# Patient Record
Sex: Female | Born: 1950 | Race: White | Hispanic: No | Marital: Single | Smoking: Current every day smoker
Health system: Southern US, Community
[De-identification: ages and names within clinical notes are randomized; demographics above are authoritative.]

## PROBLEM LIST (undated history)

## (undated) HISTORY — PX: OTHER SURGICAL HISTORY: SHX169

## (undated) HISTORY — PX: NASAL SINUS SURGERY: SHX719

## (undated) HISTORY — PX: LEG SURGERY: SHX1003

---

## 2014-07-03 ENCOUNTER — Emergency Department (HOSPITAL_COMMUNITY): Payer: BC Managed Care – PPO

## 2014-07-03 ENCOUNTER — Emergency Department (HOSPITAL_COMMUNITY)
Admission: EM | Admit: 2014-07-03 | Discharge: 2014-07-03 | Disposition: A | Discharge status: Home / Self Care | Payer: BC Managed Care – PPO | Attending: Emergency Medicine | Admitting: Emergency Medicine

## 2014-07-03 ENCOUNTER — Encounter (HOSPITAL_COMMUNITY): Payer: Self-pay | Admitting: Emergency Medicine

## 2014-07-03 DIAGNOSIS — J45901 Unspecified asthma with (acute) exacerbation: Secondary | ICD-10-CM | POA: Insufficient documentation

## 2014-07-03 DIAGNOSIS — Z720 Tobacco use: Secondary | ICD-10-CM

## 2014-07-03 DIAGNOSIS — J4520 Mild intermittent asthma, uncomplicated: Secondary | ICD-10-CM

## 2014-07-03 DIAGNOSIS — F17200 Nicotine dependence, unspecified, uncomplicated: Secondary | ICD-10-CM | POA: Diagnosis not present

## 2014-07-03 DIAGNOSIS — J431 Panlobular emphysema: Secondary | ICD-10-CM

## 2014-07-03 DIAGNOSIS — R0602 Shortness of breath: Secondary | ICD-10-CM | POA: Diagnosis present

## 2014-07-03 LAB — CBC WITH DIFFERENTIAL/PLATELET
Basophils Absolute: 0 10*3/uL (ref 0.0–0.1)
Basophils Relative: 0 % (ref 0–1)
EOS ABS: 0.1 10*3/uL (ref 0.0–0.7)
Eosinophils Relative: 1 % (ref 0–5)
HCT: 43.8 % (ref 36.0–46.0)
HEMOGLOBIN: 15.1 g/dL — AB (ref 12.0–15.0)
LYMPHS ABS: 0.7 10*3/uL (ref 0.7–4.0)
LYMPHS PCT: 9 % — AB (ref 12–46)
MCH: 31.8 pg (ref 26.0–34.0)
MCHC: 34.5 g/dL (ref 30.0–36.0)
MCV: 92.2 fL (ref 78.0–100.0)
MONOS PCT: 10 % (ref 3–12)
Monocytes Absolute: 0.8 10*3/uL (ref 0.1–1.0)
NEUTROS PCT: 80 % — AB (ref 43–77)
Neutro Abs: 6.4 10*3/uL (ref 1.7–7.7)
PLATELETS: 177 10*3/uL (ref 150–400)
RBC: 4.75 MIL/uL (ref 3.87–5.11)
RDW: 12.4 % (ref 11.5–15.5)
WBC: 7.9 10*3/uL (ref 4.0–10.5)

## 2014-07-03 LAB — COMPREHENSIVE METABOLIC PANEL
ALK PHOS: 67 U/L (ref 39–117)
ALT: 28 U/L (ref 0–35)
ANION GAP: 14 (ref 5–15)
AST: 31 U/L (ref 0–37)
Albumin: 3.5 g/dL (ref 3.5–5.2)
BUN: 13 mg/dL (ref 6–23)
CHLORIDE: 100 meq/L (ref 96–112)
CO2: 22 meq/L (ref 19–32)
Calcium: 8.9 mg/dL (ref 8.4–10.5)
Creatinine, Ser: 0.76 mg/dL (ref 0.50–1.10)
GFR, EST NON AFRICAN AMERICAN: 89 mL/min — AB (ref >90)
GLUCOSE: 113 mg/dL — AB (ref 70–99)
POTASSIUM: 4.3 meq/L (ref 3.7–5.3)
SODIUM: 136 meq/L — AB (ref 137–147)
TOTAL PROTEIN: 6.7 g/dL (ref 6.0–8.3)

## 2014-07-03 LAB — PRO B NATRIURETIC PEPTIDE: PRO B NATRI PEPTIDE: 79.9 pg/mL (ref 0–125)

## 2014-07-03 LAB — I-STAT TROPONIN, ED: Troponin i, poc: 0 ng/mL (ref 0.00–0.08)

## 2014-07-03 MED ORDER — HYDROCODONE-HOMATROPINE 5-1.5 MG/5ML PO SYRP: 5.0000 mL | ORAL_SOLUTION | Freq: Four times a day (QID) | ORAL | Status: AC | PRN

## 2014-07-03 MED ORDER — ALBUTEROL SULFATE HFA 108 (90 BASE) MCG/ACT IN AERS: 2.0000 | INHALATION_SPRAY | RESPIRATORY_TRACT | Status: AC | PRN

## 2014-07-03 MED ORDER — IPRATROPIUM BROMIDE 0.02 % IN SOLN
0.5000 mg | Freq: Once | RESPIRATORY_TRACT | Status: AC
  Administered 2014-07-03: 0.5 mg via RESPIRATORY_TRACT
  Filled 2014-07-03: qty 2.5

## 2014-07-03 MED ORDER — PREDNISONE 20 MG PO TABS: 40.0000 mg | ORAL_TABLET | Freq: Every day | ORAL | Status: AC

## 2014-07-03 MED ORDER — ALBUTEROL SULFATE (2.5 MG/3ML) 0.083% IN NEBU
5.0000 mg | INHALATION_SOLUTION | Freq: Once | RESPIRATORY_TRACT | Status: AC
  Administered 2014-07-03: 5 mg via RESPIRATORY_TRACT
  Filled 2014-07-03: qty 6

## 2014-07-03 NOTE — Discharge Instructions (Signed)
Bronchospasm °A bronchospasm is a spasm or tightening of the airways going into the lungs. During a bronchospasm breathing becomes more difficult because the airways get smaller. When this happens there can be coughing, a whistling sound when breathing (wheezing), and difficulty breathing. Bronchospasm is often associated with asthma, but not all patients who experience a bronchospasm have asthma. °CAUSES  °A bronchospasm is caused by inflammation or irritation of the airways. The inflammation or irritation may be triggered by:  °· Allergies (such as to animals, pollen, food, or mold). Allergens that cause bronchospasm may cause wheezing immediately after exposure or many hours later.   °· Infection. Viral infections are believed to be the most common cause of bronchospasm.   °· Exercise.   °· Irritants (such as pollution, cigarette smoke, strong odors, aerosol sprays, and paint fumes).   °· Weather changes. Winds increase molds and pollens in the air. Rain refreshes the air by washing irritants out. Cold air may cause inflammation.   °· Stress and emotional upset.   °SIGNS AND SYMPTOMS  °· Wheezing.   °· Excessive nighttime coughing.   °· Frequent or severe coughing with a simple cold.   °· Chest tightness.   °· Shortness of breath.   °DIAGNOSIS  °Bronchospasm is usually diagnosed through a history and physical exam. Tests, such as chest X-rays, are sometimes done to look for other conditions. °TREATMENT  °· Inhaled medicines can be given to open up your airways and help you breathe. The medicines can be given using either an inhaler or a nebulizer machine. °· Corticosteroid medicines may be given for severe bronchospasm, usually when it is associated with asthma. °HOME CARE INSTRUCTIONS  °· Always have a plan prepared for seeking medical care. Know when to call your health care provider and local emergency services (911 in the [place] Know where you can access local emergency care. °· Only take medicines as  directed by your health care provider. °· If you were prescribed an inhaler or nebulizer machine, ask your health care provider to explain how to use it correctly. Always use a spacer with your inhaler if you were given one. °· It is necessary to remain calm during an attack. Try to relax and breathe more slowly.  °· Control your home environment in the following ways:   °¨ Change your heating and air conditioning filter at least once a month.   °¨ Limit your use of fireplaces and wood stoves. °¨ Do not smoke and do not allow smoking in your home.   °¨ Avoid exposure to perfumes and fragrances.   °¨ Get rid of pests (such as roaches and mice) and their droppings.   °¨ Throw away plants if you see mold on them.   °¨ Keep your house clean and dust free.   °¨ Replace carpet with wood, tile, or vinyl flooring. Carpet can trap dander and dust.   °¨ Use allergy-proof pillows, mattress covers, and box spring covers.   °¨ Wash bed sheets and blankets every week in hot water and dry them in a dryer.   °¨ Use blankets that are made of polyester or cotton.   °¨ Wash hands frequently. °SEEK MEDICAL CARE IF:  °· You have muscle aches.   °· You have chest pain.   °· The sputum changes from clear or white to yellow, green, gray, or bloody.   °· The sputum you cough up gets thicker.   °· There are problems that may be related to the medicine you are given, such as a rash, itching, swelling, or trouble breathing.   °SEEK IMMEDIATE MEDICAL CARE IF:  °· You have worsening wheezing and coughing even   after taking your prescribed medicines.   You have increased difficulty breathing.   You develop severe chest pain. MAKE SURE YOU:   Understand these instructions.  Will watch your condition.  Will get help right away if you are not doing well or get worse. Document Released: 09/14/2003 Document Revised: 09/16/2013 Document Reviewed: 03/03/2013 [facility] Patient Information 2015 [place], [place]. This information is not  intended to replace advice given to you by your health care provider. Make sure you discuss any questions you have with your health care provider.  Smoking Cessation Quitting smoking is important to your health and has many advantages. However, it is not always easy to quit since nicotine is a very addictive drug. Oftentimes, people try 3 times or more before being able to quit. This document explains the best ways for you to prepare to quit smoking. Quitting takes hard work and a lot of effort, but you can do it. ADVANTAGES OF QUITTING SMOKING  You will live longer, feel better, and live better.  Your body will feel the impact of quitting smoking almost immediately.  Within 20 minutes, blood pressure decreases. Your pulse returns to its normal level.  After 8 hours, carbon monoxide levels in the blood return to normal. Your oxygen level increases.  After 24 hours, the chance of having a heart attack starts to decrease. Your breath, hair, and body stop smelling like smoke.  After 48 hours, damaged nerve endings begin to recover. Your sense of taste and smell improve.  After 72 hours, the body is virtually free of nicotine. Your bronchial tubes relax and breathing becomes easier.  After 2 to 12 weeks, lungs can hold more air. Exercise becomes easier and circulation improves.  The risk of having a heart attack, stroke, cancer, or lung disease is greatly reduced.  After 1 year, the risk of coronary heart disease is cut in half.  After 5 years, the risk of stroke falls to the same as a nonsmoker.  After 10 years, the risk of lung cancer is cut in half and the risk of other cancers decreases significantly.  After 15 years, the risk of coronary heart disease drops, usually to the level of a nonsmoker.  If you are pregnant, quitting smoking will improve your chances of having a healthy baby.  The people you live with, especially any children, will be healthier.  You will have extra money  to spend on things other than cigarettes. QUESTIONS TO THINK ABOUT BEFORE ATTEMPTING TO QUIT You may want to talk about your answers with your health care provider.  Why do you want to quit?  If you tried to quit in the past, what helped and what did not?  What will be the most difficult situations for you after you quit? How will you plan to handle them?  Who can help you through the tough times? Your family? Friends? A health care provider?  What pleasures do you get from smoking? What ways can you still get pleasure if you quit? Here are some questions to ask your health care provider:  How can you help me to be successful at quitting?  What medicine do you think would be best for me and how should I take it?  What should I do if I need more help?  What is smoking withdrawal like? How can I get information on withdrawal? GET READY  Set a quit date.  Change your environment by getting rid of all cigarettes, ashtrays, matches, and lighters in your  home, car, or work. Do not let people smoke in your home.  Review your past attempts to quit. Think about what worked and what did not. GET SUPPORT AND ENCOURAGEMENT You have a better chance of being successful if you have help. You can get support in many ways.  Tell your family, friends, and coworkers that you are going to quit and need their support. Ask them not to smoke around you.  Get individual, group, or telephone counseling and support. Programs are available at [facility]. Call your local health department for information about programs in your area.  Spiritual beliefs and practices may help some smokers quit.  Download a "quit meter" on your computer to keep track of quit statistics, such as how long you have gone without smoking, cigarettes not smoked, and money saved.  Get a self-help book about quitting smoking and staying off tobacco. LEARN NEW SKILLS AND BEHAVIORS  Distract yourself from  urges to smoke. Talk to someone, go for a walk, or occupy your time with a task.  Change your normal routine. Take a different route to work. Drink tea instead of coffee. Eat breakfast in a different place.  Reduce your stress. Take a hot bath, exercise, or read a book.  Plan something enjoyable to do every day. Reward yourself for not smoking.  Explore interactive web-based programs that specialize in helping you quit. GET MEDICINE AND USE IT CORRECTLY Medicines can help you stop smoking and decrease the urge to smoke. Combining medicine with the above behavioral methods and support can greatly increase your chances of successfully quitting smoking.  Nicotine replacement therapy helps deliver nicotine to your body without the negative effects and risks of smoking. Nicotine replacement therapy includes nicotine gum, lozenges, inhalers, nasal sprays, and skin patches. Some may be available over-the-counter and others require a prescription.  Antidepressant medicine helps people abstain from smoking, but how this works is unknown. This medicine is available by prescription.  Nicotinic receptor partial agonist medicine simulates the effect of nicotine in your brain. This medicine is available by prescription. Ask your health care provider for advice about which medicines to use and how to use them based on your health history. Your health care provider will tell you what side effects to look out for if you choose to be on a medicine or therapy. Carefully read the information on the package. Do not use any other product containing nicotine while using a nicotine replacement product.  RELAPSE OR DIFFICULT SITUATIONS Most relapses occur within the first 3 months after quitting. Do not be discouraged if you start smoking again. Remember, most people try several times before finally quitting. You may have symptoms of withdrawal because your body is used to nicotine. You may crave cigarettes, be irritable,  feel very hungry, cough often, get headaches, or have difficulty concentrating. The withdrawal symptoms are only temporary. They are strongest when you first quit, but they will go away within 10-14 days. To reduce the chances of relapse, try to:  Avoid drinking alcohol. Drinking lowers your chances of successfully quitting.  Reduce the amount of caffeine you consume. Once you quit smoking, the amount of caffeine in your body increases and can give you symptoms, such as a rapid heartbeat, sweating, and anxiety.  Avoid smokers because they can make you want to smoke.  Do not let weight gain distract you. Many smokers will gain weight when they quit, usually less than 10 pounds. Eat a healthy diet and stay  active. You can always lose the weight gained after you quit.  Find ways to improve your mood other than smoking. FOR MORE INFORMATION  www.smokefree.gov  Document Released: 09/05/2001 Document Revised: 01/26/2014 Document Reviewed: 12/21/2011 [facility] Patient Information 2015 [place], [place]. This information is not intended to replace advice given to you by your health care provider. Make sure you discuss any questions you have with your health care provider.  How to Use an Inhaler Proper inhaler technique is very important. Good technique ensures that the medicine reaches the lungs. Poor technique results in depositing the medicine on the tongue and back of the throat rather than in the airways. If you do not use the inhaler with good technique, the medicine will not help you. STEPS TO FOLLOW IF USING AN INHALER WITHOUT AN EXTENSION TUBE 1. Remove the cap from the inhaler. 2. If you are using the inhaler for the first time, you will need to prime it. Shake the inhaler for 5 seconds and release four puffs into the air, away from your face. Ask your health care provider or pharmacist if you have questions about priming your inhaler. 3. Shake the inhaler for 5 seconds before each breath in  (inhalation). 4. Position the inhaler so that the top of the canister faces up. 5. Put your index finger on the top of the medicine canister. Your thumb supports the bottom of the inhaler. 6. Open your mouth. 7. Either place the inhaler between your teeth and place your lips tightly around the mouthpiece, or hold the inhaler 1-2 inches away from your open mouth. If you are unsure of which technique to use, ask your health care provider. 8. Breathe out (exhale) normally and as completely as possible. 9. Press the canister down with your index finger to release the medicine. 10. At the same time as the canister is pressed, inhale deeply and slowly until your lungs are completely filled. This should take 4-6 seconds. Keep your tongue down. 11. Hold the medicine in your lungs for 5-10 seconds (10 seconds is best). This helps the medicine get into the small airways of your lungs. 12. Breathe out slowly, through pursed lips. Whistling is an example of pursed lips. 13. Wait at least 15-30 seconds between puffs. Continue with the above steps until you have taken the number of puffs your health care provider has ordered. Do not use the inhaler more than your health care provider tells you. 14. Replace the cap on the inhaler. 15. Follow the directions from your health care provider or the inhaler insert for cleaning the inhaler. STEPS TO FOLLOW IF USING AN INHALER WITH AN EXTENSION (SPACER) 1. Remove the cap from the inhaler. 2. If you are using the inhaler for the first time, you will need to prime it. Shake the inhaler for 5 seconds and release four puffs into the air, away from your face. Ask your health care provider or pharmacist if you have questions about priming your inhaler. 3. Shake the inhaler for 5 seconds before each breath in (inhalation). 4. Place the open end of the spacer onto the mouthpiece of the inhaler. 5. Position the inhaler so that the top of the canister faces up and the spacer  mouthpiece faces you. 6. Put your index finger on the top of the medicine canister. Your thumb supports the bottom of the inhaler and the spacer. 7. Breathe out (exhale) normally and as completely as possible. 8. Immediately after exhaling, place the spacer between your teeth and into  your mouth. Close your lips tightly around the spacer. 9. Press the canister down with your index finger to release the medicine. 10. At the same time as the canister is pressed, inhale deeply and slowly until your lungs are completely filled. This should take 4-6 seconds. Keep your tongue down and out of the way. 11. Hold the medicine in your lungs for 5-10 seconds (10 seconds is best). This helps the medicine get into the small airways of your lungs. Exhale. 12. Repeat inhaling deeply through the spacer mouthpiece. Again hold that breath for up to 10 seconds (10 seconds is best). Exhale slowly. If it is difficult to take this second deep breath through the spacer, breathe normally several times through the spacer. Remove the spacer from your mouth. 13. Wait at least 15-30 seconds between puffs. Continue with the above steps until you have taken the number of puffs your health care provider has ordered. Do not use the inhaler more than your health care provider tells you. 14. Remove the spacer from the inhaler, and place the cap on the inhaler. 15. Follow the directions from your health care provider or the inhaler insert for cleaning the inhaler and spacer. If you are using different kinds of inhalers, use your quick relief medicine to open the airways 10-15 minutes before using a steroid if instructed to do so by your health care provider. If you are unsure which inhalers to use and the order of using them, ask your health care provider, nurse, or respiratory therapist. If you are using a steroid inhaler, always rinse your mouth with water after your last puff, then gargle and spit out the water. Do not swallow the  water. AVOID:  Inhaling before or after starting the spray of medicine. It takes practice to coordinate your breathing with triggering the spray.  Inhaling through the nose (rather than the mouth) when triggering the spray. HOW TO DETERMINE IF YOUR INHALER IS FULL OR NEARLY EMPTY You cannot know when an inhaler is empty by shaking it. A few inhalers are now being made with dose counters. Ask your health care provider for a prescription that has a dose counter if you feel you need that extra help. If your inhaler does not have a counter, ask your health care provider to help you determine the date you need to refill your inhaler. Write the refill date on a calendar or your inhaler canister. Refill your inhaler 7-10 days before it runs out. Be sure to keep an adequate supply of medicine. This includes making sure it is not expired, and that you have a spare inhaler.  SEEK MEDICAL CARE IF:   Your symptoms are only partially relieved with your inhaler.  You are having trouble using your inhaler.  You have some increase in phlegm. SEEK IMMEDIATE MEDICAL CARE IF:   You feel little or no relief with your inhalers. You are still wheezing and are feeling shortness of breath or tightness in your chest or both.  You have dizziness, headaches, or a fast heart rate.  You have chills, fever, or night sweats.  You have a noticeable increase in phlegm production, or there is blood in the phlegm. MAKE SURE YOU:   Understand these instructions.  Will watch your condition.  Will get help right away if you are not doing well or get worse. Document Released: 09/08/2000 Document Revised: 07/02/2013 Document Reviewed: 04/10/2013 [facility] Patient Information 2015 [place], [place]. This information is not intended to replace advice given to you  by your health care provider. Make sure you discuss any questions you have with your health care provider. ° °

## 2014-07-03 NOTE — ED Notes (Signed)
Pt states she has been having some shortness of breath for the past couple days  States yesterday she was walking across campus and became very short of breath where she had to stop and catch her breath  Pt states she has had congestion and a cough and has been using Mucinex to help break it up so she could get it out  Pt states today it has not been any better  Pt states she has been coughing and feeling run down  Pt states tonight she developed some diarrhea  Pt states it is hard to take in a deep breath and when she lays down flat she feels like she cannot breathe at all

## 2014-07-03 NOTE — ED Provider Notes (Signed)
CSN: [id number]     Arrival date & time 07/03/14  0117 History   First MD Initiated Contact with Patient 07/03/14 0240     Chief Complaint  Patient presents with  . Shortness of Breath     (Consider location/radiation/quality/duration/timing/severity/associated sxs/prior Treatment) HPI Vanessa Barbaraatricia E Underwood Is a 63 year old female who presents the emergency department chief complaint of shortness of breath. She has a past medical history of smoking. Patient states that about 2 weeks ago she was walking across campus with friends when she became very short of breath. Patient rested and her symptoms got better. She states that she has had a cough and congestion for the past 2 days. She has been using Mucinex. She denies any production. She was working on [employer] papers and answering e-mails when she began coughing. She felt tightness in her chest. She states that she feels as if she cannot breathe when she lies flat. She denies orthopnea or paroxysmal nocturnal dyspnea. She denies any swelling in her ankles. Patient denies chest pain, radiation to the left jaw, left shoulder, nausea, diaphoresis. She continues to smoke heavily. She states she is trying to quit with lozenges. She denies fevers, chills, myalgias. She endorses malaise.   History reviewed. No pertinent past medical history. Past Surgical History  Procedure Laterality Date  . Leg surgery    . Nasal sinus surgery    . Pinched ulnar nerve surgery bilateral     Family History  Problem Relation Age of Onset  . Cancer Mother   . Heart failure Father   . Emphysema Father    History  Substance Use Topics  . Smoking status: Current Every Day Smoker  . Smokeless tobacco: Not on file  . Alcohol Use: No   OB History   Grav Para Term Preterm Abortions TAB SAB Ect Mult Living                 Review of Systems  Ten systems reviewed and are negative for acute change, except as noted in the HPI.    Allergies  Augmentin  Home  Medications   Prior to Admission medications   Not on File   BP 141/71  Pulse 78  Temp(Src) 98.6 F (37 C) (Oral)  Resp 20  SpO2 96% Physical Exam  Nursing note and vitals reviewed. Constitutional: She is oriented to person, place, and time. She appears well-developed and well-nourished. No distress.  HENT:  Head: Normocephalic and atraumatic.  Eyes: Conjunctivae are normal. No scleral icterus.  Neck: Normal range of motion.  Cardiovascular: Normal rate, regular rhythm and normal heart sounds.  Exam reveals no gallop and no friction rub.   No murmur heard. Pulmonary/Chest: Effort normal. No respiratory distress. She has wheezes (Cough with expiratory wheeze).  Abdominal: Soft. Bowel sounds are normal. She exhibits no distension and no mass. There is no tenderness. There is no guarding.  Neurological: She is alert and oriented to person, place, and time.  Skin: Skin is warm and dry. She is not diaphoretic.    ED Course  Procedures (including critical care time) Labs Review Labs Reviewed  CBC WITH DIFFERENTIAL - Abnormal; Notable for the following:    Hemoglobin 15.1 (*)    Neutrophils Relative % 80 (*)    Lymphocytes Relative 9 (*)    All other components within normal limits  COMPREHENSIVE METABOLIC PANEL - Abnormal; Notable for the following:    Sodium 136 (*)    Glucose, Bld 113 (*)    Total  Bilirubin <0.2 (*)    GFR calc non Af Amer 89 (*)    All other components within normal limits  PRO B NATRIURETIC PEPTIDE  URINALYSIS, ROUTINE W REFLEX MICROSCOPIC  I-STAT TROPOININ, ED    Imaging Review Dg Chest 2 View  07/03/2014   CLINICAL DATA:  Intermittent shortness of breath.  Initial encounter  EXAM: CHEST  2 VIEW  COMPARISON:  None available.  FINDINGS: The heart size is normal. Emphysematous changes are noted. No focal airspace disease is evident. There is no edema or effusion to suggest failure. The visualized soft tissues and bony thorax are unremarkable.  IMPRESSION:  1. Moderate emphysematous changes. 2. No acute abnormality.   Electronically Signed   By: Gennette Pachris  Mattern M.D.   On: 07/03/2014 02:14     EKG Interpretation   Date/Time:  Friday July 03 2014 02:54:38 EDT Ventricular Rate:  67 PR Interval:    QRS Duration: 86 QT Interval:  430 QTC Calculation: 454 R Axis:   80 Text Interpretation:  Normal sinus rhythm Confirmed by WARD,  DO, KRISTEN  ([id number]) on 07/03/2014 3:09:10 AM      MDM   Final diagnoses:  RAD (reactive airway disease), mild intermittent, uncomplicated  Panlobular emphysema  Tobacco abuse    Here with complaint of shortness of breath. Her chest x-ray shows moderate emphysematous changes without acute abnormality. Her symptoms are related to reactive airway from underlying URI and underlying COPD. Have ordered a DuoNeb treatment. Polycythemia secondary to smoking. She is slightly elevated glucose. Negative troponin and normal BNP. Her EKG is without signs of ischemia presently.    Patient sig improved with neb tx. Appears to be COPD exacerbation. No cp and I doubt CHF or cardiac etiology. D/c with sx treatment. I spent 5 minute in smoking cessation counseling. i have discussed POC with Dr Elesa MassedWard who agrees with plan    I personally reviewed the imaging tests through PACS system. I have reviewed and interpreted Lab values. I reviewed available ER/hospitalization records through the EMR     Arthor Captainbigail Ronnesha Mester, [place]-C 07/08/14 1244

## 2014-07-08 NOTE — ED Provider Notes (Signed)
Medical screening examination/treatment/procedure(s) were performed by non-physician practitioner and as supervising physician I was immediately available for consultation/collaboration.   EKG Interpretation   Date/Time:  Friday July 03 2014 02:54:38 EDT Ventricular Rate:  67 PR Interval:    QRS Duration: 86 QT Interval:  430 QTC Calculation: 454 R Axis:   80 Text Interpretation:  Normal sinus rhythm Confirmed by Areli Frary,  DO, Beatric Fulop  [id number]) on 07/03/2014 3:09:10 AM        Pamela MawKristen N Dawnelle Warman, DO 07/08/14 1422

## 2015-07-04 IMAGING — CR DG CHEST 2V
2 series · 2 of 2 positions shown · non-contrast
Comparison: None available.

CLINICAL DATA: Intermittent shortness of breath.  Initial encounter

EXAM:
CHEST  2 VIEW

[w chest pa]
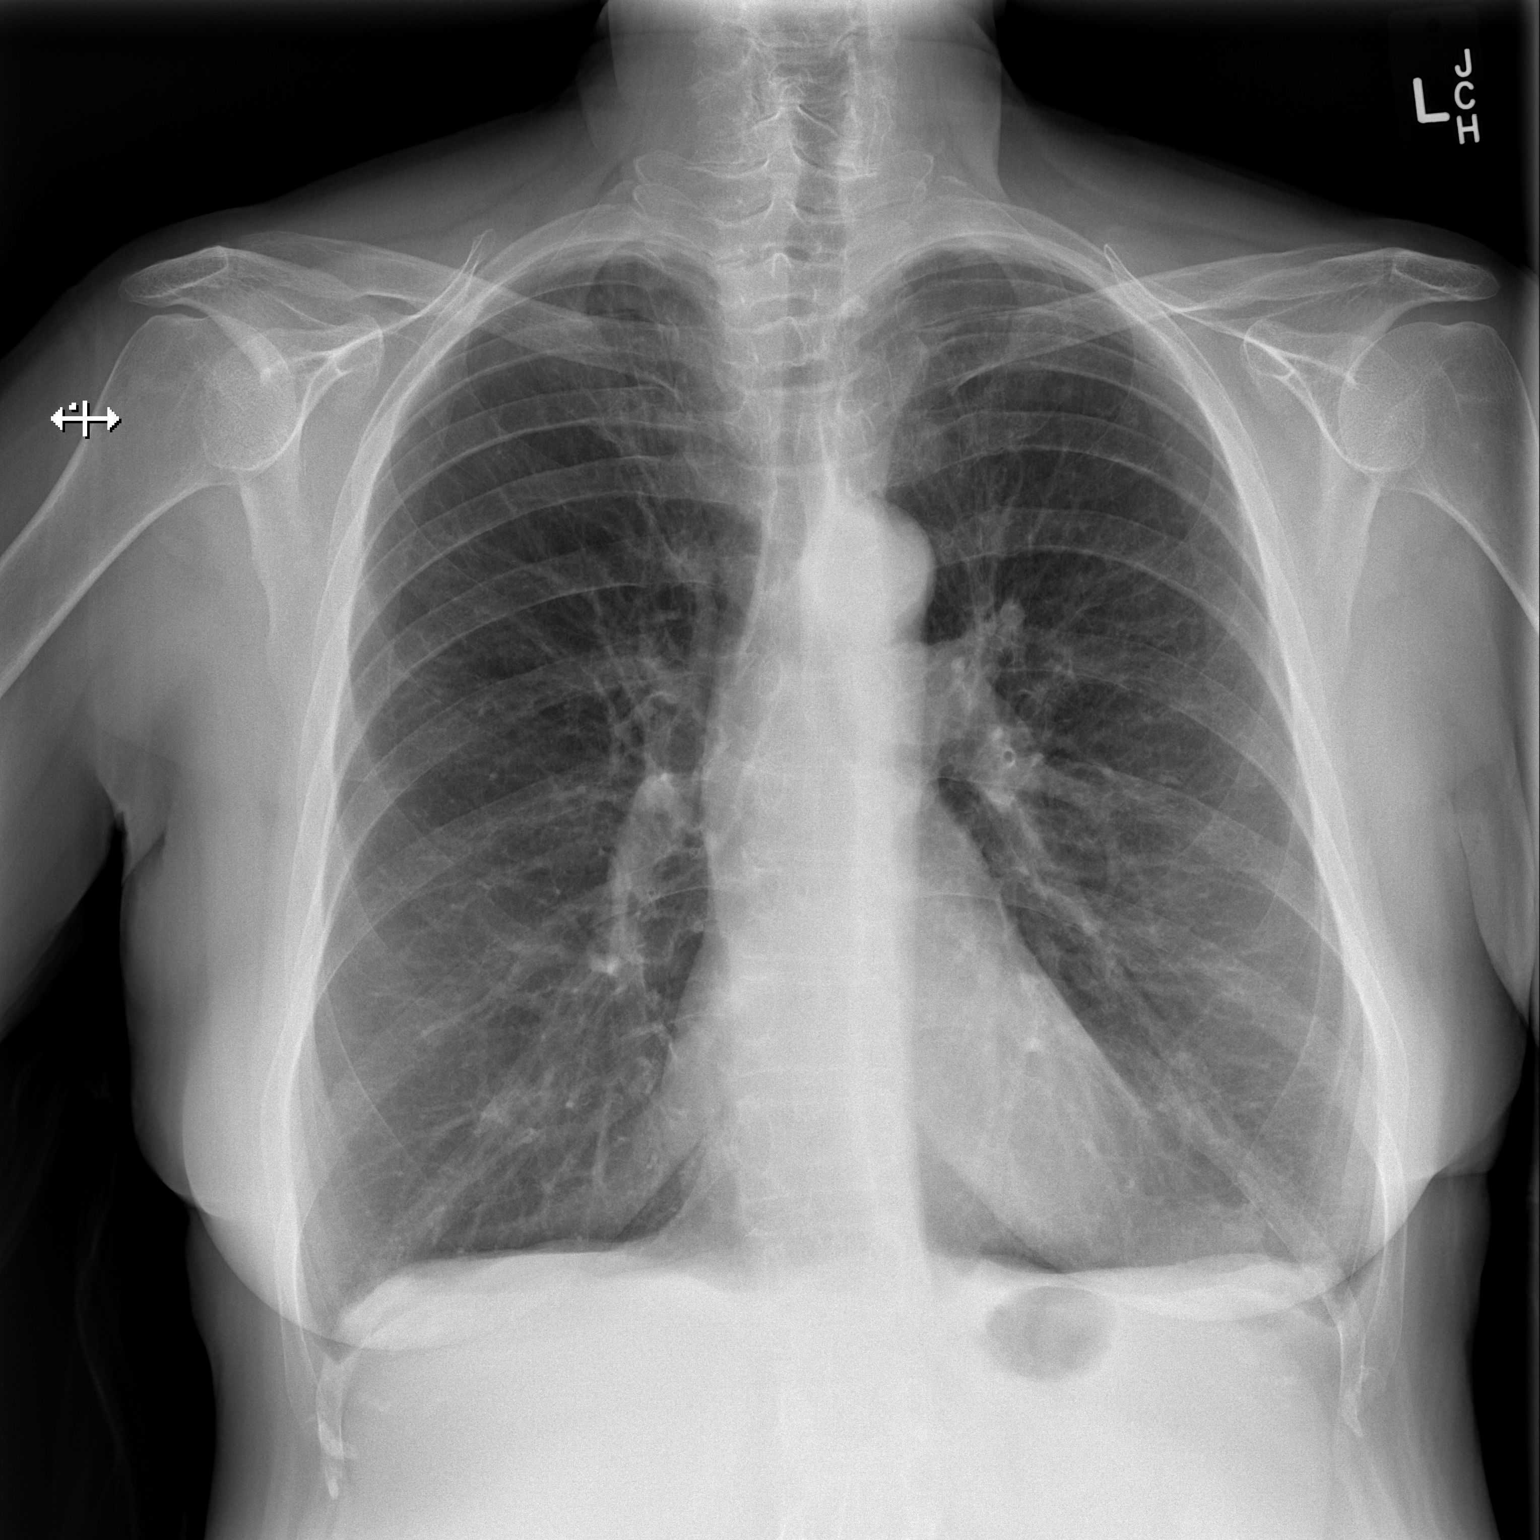

[w chest lat]
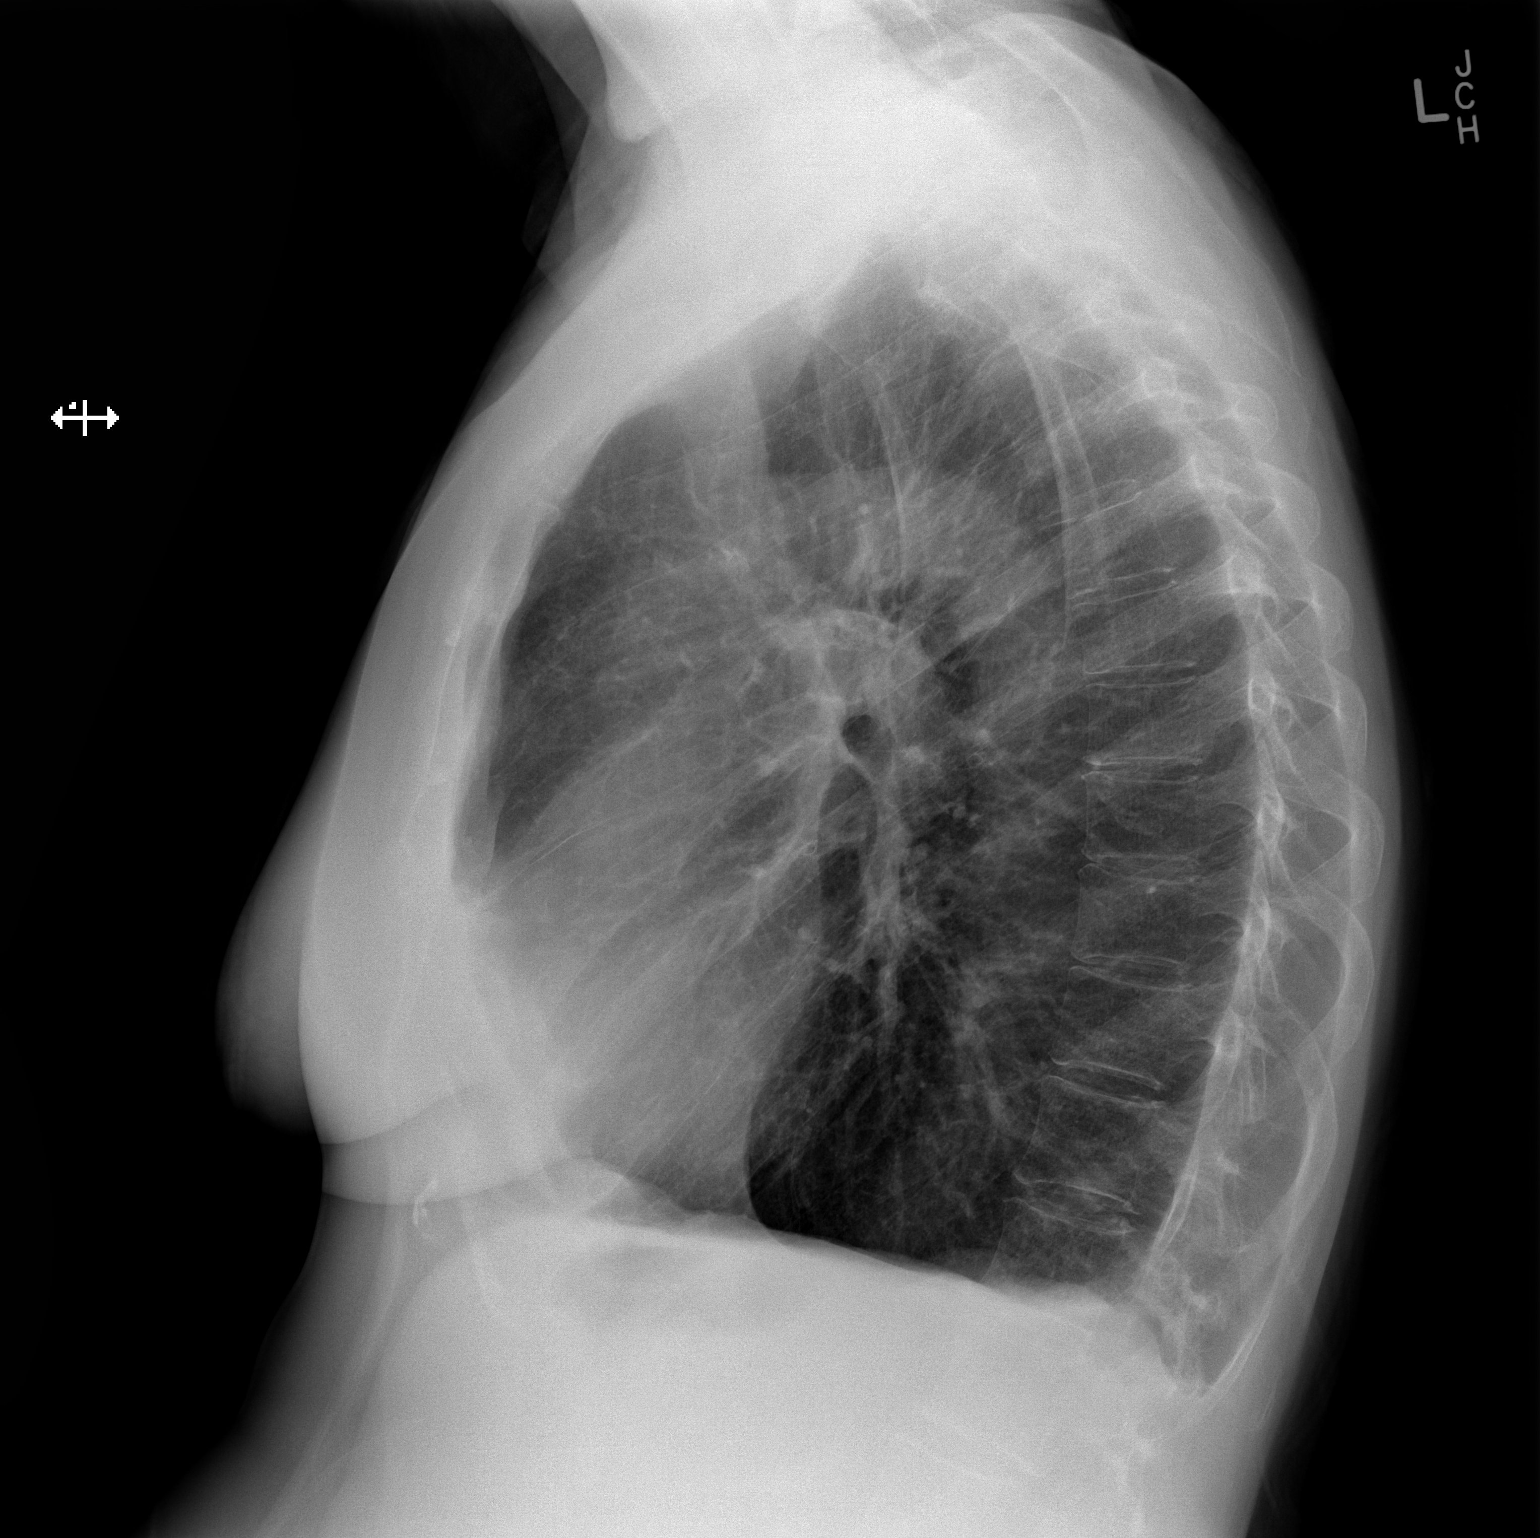

[2 of 2 positions shown; findings below may reference images not displayed]

FINDINGS: The heart size is normal. Emphysematous changes are noted. No focal
airspace disease is evident. There is no edema or effusion to
suggest failure. The visualized soft tissues and bony thorax are
unremarkable.
IMPRESSION: 1. Moderate emphysematous changes.
2. No acute abnormality.
